# Patient Record
Sex: Male | Born: 1985 | Race: White | Hispanic: No | Smoking: Current every day smoker
Health system: Southern US, Community
[De-identification: ages and names within clinical notes are randomized; demographics above are authoritative.]

## PROBLEM LIST (undated history)

## (undated) DIAGNOSIS — J40 Bronchitis, not specified as acute or chronic: Secondary | ICD-10-CM

## (undated) DIAGNOSIS — J189 Pneumonia, unspecified organism: Secondary | ICD-10-CM

## (undated) DIAGNOSIS — J449 Chronic obstructive pulmonary disease, unspecified: Secondary | ICD-10-CM

---
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
## 2007-02-17 ENCOUNTER — Emergency Department (HOSPITAL_COMMUNITY): Admission: EM | Admit: 2007-02-17 | Discharge: 2007-02-17 | Payer: Self-pay | Admitting: Emergency Medicine

## 2016-10-17 ENCOUNTER — Encounter (INDEPENDENT_AMBULATORY_CARE_PROVIDER_SITE_OTHER): Payer: Self-pay | Admitting: Ophthalmology

## 2018-11-25 ENCOUNTER — Emergency Department (HOSPITAL_COMMUNITY): Payer: Self-pay

## 2018-11-25 ENCOUNTER — Emergency Department (HOSPITAL_COMMUNITY)
Admission: EM | Admit: 2018-11-25 | Discharge: 2018-11-25 | Disposition: A | Discharge status: Home / Self Care | Payer: Self-pay | Attending: Emergency Medicine | Admitting: Emergency Medicine

## 2018-11-25 ENCOUNTER — Other Ambulatory Visit: Payer: Self-pay

## 2018-11-25 ENCOUNTER — Encounter (HOSPITAL_COMMUNITY): Payer: Self-pay

## 2018-11-25 DIAGNOSIS — R059 Cough, unspecified: Secondary | ICD-10-CM

## 2018-11-25 DIAGNOSIS — R0602 Shortness of breath: Secondary | ICD-10-CM | POA: Insufficient documentation

## 2018-11-25 DIAGNOSIS — R05 Cough: Secondary | ICD-10-CM | POA: Insufficient documentation

## 2018-11-25 DIAGNOSIS — F17210 Nicotine dependence, cigarettes, uncomplicated: Secondary | ICD-10-CM | POA: Insufficient documentation

## 2018-11-25 DIAGNOSIS — Z20828 Contact with and (suspected) exposure to other viral communicable diseases: Secondary | ICD-10-CM | POA: Insufficient documentation

## 2018-11-25 MED ORDER — DOXYCYCLINE HYCLATE 100 MG PO CAPS: 100.0000 mg | ORAL_CAPSULE | Freq: Two times a day (BID) | ORAL | 0 refills | Status: AC

## 2018-11-25 MED ORDER — ALBUTEROL SULFATE (2.5 MG/3ML) 0.083% IN NEBU: 5.0000 mg | INHALATION_SOLUTION | Freq: Once | RESPIRATORY_TRACT | Status: DC

## 2018-11-25 MED ORDER — PREDNISONE 20 MG PO TABS: 40.0000 mg | ORAL_TABLET | Freq: Every day | ORAL | 0 refills | Status: AC

## 2018-11-25 MED ORDER — ALBUTEROL SULFATE HFA 108 (90 BASE) MCG/ACT IN AERS: 1.0000 | INHALATION_SPRAY | Freq: Four times a day (QID) | RESPIRATORY_TRACT | 0 refills | Status: AC | PRN

## 2018-11-25 NOTE — Discharge Instructions (Signed)
You were seen in the ED today with trouble breathing and cough.  I am testing you for COVID-19.  Please stay isolated until results come back and you are feeling better for at least 3 days prior to returning to work.  You can check the results in the MyChart app.  Return to the emergency department any new or suddenly worsening symptoms.  I am also calling in a prescription for steroid and antibiotic.  If your Covid test comes back positive you can stop these medications.  Return to the emergency department with any new or worsening symptoms.

## 2018-11-25 NOTE — ED Provider Notes (Signed)
Emergency Department Provider Note   I have reviewed the triage vital signs and the nursing notes.   HISTORY  Chief Complaint Shortness of Breath   HPI Cody Odom is a 33 y.o. male presents to the emergency department for evaluation of shortness of breath and cough worsening over the past 5 days.  He has not had fevers or shaking chills.  No sore throat, runny nose, loss of taste/smell.  Patient is a heavy smoker but has no prior diagnosis of asthma, COPD, other lung condition.  He is describing some chest discomfort but states it is only with coughing and he is not having any chest pain at rest.  No heart palpitations.  No radiation of symptoms or other modifying factors.  History reviewed. No pertinent past medical history.  There are no active problems to display for this patient.   History reviewed. No pertinent surgical history.  Allergies Shellfish allergy  History reviewed. No pertinent family history.  Social History Social History   Tobacco Use  . Smoking status: Current Every Day Smoker  . Smokeless tobacco: Never Used  Substance Use Topics  . Alcohol use: Yes  . Drug use: Never    Review of Systems  Constitutional: No fever/chills Eyes: No visual changes. ENT: No sore throat. Cardiovascular: Positive chest pain with cough. Respiratory: Positive shortness of breath and cough.  Gastrointestinal: No abdominal pain.  No nausea, no vomiting.  No diarrhea.  No constipation. Genitourinary: Negative for dysuria. Musculoskeletal: Negative for back pain. Skin: Negative for rash. Neurological: Negative for headaches, focal weakness or numbness.  10-point ROS otherwise negative.  ____________________________________________   PHYSICAL EXAM:  VITAL SIGNS: ED Triage Vitals  Enc Vitals Group     BP 11/25/18 1607 (!) 144/88     Pulse Rate 11/25/18 1607 92     Resp 11/25/18 1607 20     Temp 11/25/18 1607 98.3 F (36.8 C)     Temp Source 11/25/18 1607  Oral     SpO2 11/25/18 1607 100 %   Constitutional: Alert and oriented. Well appearing and in no acute distress. Eyes: Conjunctivae are normal.   Head: Atraumatic. Nose: No congestion/rhinnorhea. Mouth/Throat: Mucous membranes are moist. Neck: No stridor.   Cardiovascular: Normal rate, regular rhythm. Good peripheral circulation. Grossly normal heart sounds.   Respiratory: Normal respiratory effort.  No retractions. Lungs CTAB. Gastrointestinal: No distention.  Musculoskeletal: No gross deformities of extremities. Neurologic:  Normal speech and language. Skin:  Skin is warm, dry and intact. No rash noted.   ____________________________________________   LABS (all labs ordered are listed, but only abnormal results are displayed)  Labs Reviewed  NOVEL CORONAVIRUS, NAA (HOSP ORDER, SEND-OUT TO REF LAB; TAT 18-24 HRS)   ____________________________________________  EKG   EKG Interpretation  Date/Time:  Thursday November 25 2018 16:12:21 EST Ventricular Rate:  95 PR Interval:    QRS Duration: 95 QT Interval:  351 QTC Calculation: 442 R Axis:   56 Text Interpretation: Sinus rhythm Borderline T wave abnormalities No STEMI Confirmed by Alona Bene [id number]) on 11/25/2018 4:48:30 PM       ____________________________________________  RADIOLOGY  Dg Chest 2 View  Result Date: 11/25/2018 CLINICAL DATA:  Shortness of breath for the past 5 days. EXAM: CHEST - 2 VIEW COMPARISON:  None. FINDINGS: The heart size and mediastinal contours are within normal limits. Both lungs are clear. The visualized skeletal structures are unremarkable. IMPRESSION: No active cardiopulmonary disease. Electronically Signed   By: Vickki Hearing.D.  On: 11/25/2018 16:31    ____________________________________________   PROCEDURES  Procedure(s) performed:   Procedures  None  ____________________________________________   INITIAL IMPRESSION / ASSESSMENT AND PLAN / ED COURSE  Pertinent  labs & imaging results that were available during my care of the patient were reviewed by me and considered in my medical decision making (see chart for details).   Patient presents to the emergency department for evaluation of cough and shortness of breath.  No wheezing on exam.  No hypoxemia.  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  I am sending Covid testing and he will remain in isolation until his results come back and has been feeling well for at least 3 days.  Patient to follow results in [place].  Plan to treat his bronchitis with albuterol, steroid, antibiotics.  Patient to return with any new or worsening symptoms.  Also provided contact information for PCP.  Cody Odom was evaluated in Emergency Department on 11/25/2018 for the symptoms described in the history of present illness. He was evaluated in the context of the global COVID-19 pandemic, which necessitated consideration that the patient might be at risk for infection with the SARS-CoV-2 virus that causes COVID-19. Institutional protocols and algorithms that pertain to the evaluation of patients at risk for COVID-19 are in a state of rapid change based on information released by regulatory bodies including the CDC and federal and state organizations. These policies and algorithms were followed during the patient's care in the ED.  ____________________________________________  FINAL CLINICAL IMPRESSION(S) / ED DIAGNOSES  Final diagnoses:  SOB (shortness of breath)  Cough     MEDICATIONS GIVEN DURING THIS VISIT:  Medications  albuterol (PROVENTIL) (2.5 MG/3ML) 0.083% nebulizer solution 5 mg (has no administration in time range)     NEW OUTPATIENT MEDICATIONS STARTED DURING THIS VISIT:  New Prescriptions   ALBUTEROL (VENTOLIN HFA) 108 (90 BASE) MCG/ACT INHALER    Inhale 1-2 puffs into the lungs every 6 (six) hours as needed for shortness of breath.   DOXYCYCLINE (VIBRAMYCIN) 100  MG CAPSULE    Take 1 capsule (100 mg total) by mouth 2 (two) times daily for 7 days.   PREDNISONE (DELTASONE) 20 MG TABLET    Take 2 tablets (40 mg total) by mouth daily for 5 days.    Note:  This document was prepared using Dragon voice recognition software and may include unintentional dictation errors.  Nanda Quinton, MD, [facility] Emergency Medicine    Navid Lenzen, [facility], MD 11/25/18 [phone]

## 2018-11-25 NOTE — ED Triage Notes (Signed)
Pt presents with c/o shortness of breath for the past 5 days. Pt denies being around anyone who has been sick. Pt reports some chest pain as well. Pt in NAD. Pt also reports some ETOH use today.

## 2018-11-26 LAB — NOVEL CORONAVIRUS, NAA (HOSP ORDER, SEND-OUT TO REF LAB; TAT 18-24 HRS): SARS-CoV-2, NAA: NOT DETECTED

## 2019-01-31 ENCOUNTER — Ambulatory Visit: Payer: Self-pay | Attending: Internal Medicine

## 2019-01-31 DIAGNOSIS — Z20822 Contact with and (suspected) exposure to covid-19: Secondary | ICD-10-CM | POA: Insufficient documentation

## 2019-02-01 LAB — NOVEL CORONAVIRUS, NAA: SARS-CoV-2, NAA: NOT DETECTED

## 2021-06-07 ENCOUNTER — Emergency Department (HOSPITAL_COMMUNITY)
Admission: EM | Admit: 2021-06-07 | Discharge: 2021-06-07 | Disposition: A | Discharge status: Home / Self Care | Payer: Managed Care, Other (non HMO) | Attending: Emergency Medicine | Admitting: Emergency Medicine

## 2021-06-07 ENCOUNTER — Encounter (HOSPITAL_COMMUNITY): Payer: Self-pay

## 2021-06-07 ENCOUNTER — Other Ambulatory Visit: Payer: Self-pay

## 2021-06-07 DIAGNOSIS — X102XXA Contact with fats and cooking oils, initial encounter: Secondary | ICD-10-CM | POA: Diagnosis not present

## 2021-06-07 DIAGNOSIS — T23231A Burn of second degree of multiple right fingers (nail), not including thumb, initial encounter: Secondary | ICD-10-CM | POA: Diagnosis not present

## 2021-06-07 DIAGNOSIS — T310 Burns involving less than 10% of body surface: Secondary | ICD-10-CM | POA: Diagnosis not present

## 2021-06-07 DIAGNOSIS — T23031A Burn of unspecified degree of multiple right fingers (nail), not including thumb, initial encounter: Secondary | ICD-10-CM | POA: Diagnosis present

## 2021-06-07 DIAGNOSIS — Y93G3 Activity, cooking and baking: Secondary | ICD-10-CM | POA: Diagnosis not present

## 2021-06-07 MED ORDER — IBUPROFEN 800 MG PO TABS
800.0000 mg | ORAL_TABLET | Freq: Once | ORAL | Status: AC
  Administered 2021-06-07: 800 mg via ORAL
  Filled 2021-06-07: qty 1

## 2021-06-07 MED ORDER — BACITRACIN ZINC 500 UNIT/GM EX OINT
TOPICAL_OINTMENT | Freq: Once | CUTANEOUS | Status: AC
  Administered 2021-06-07: 1 via TOPICAL
  Filled 2021-06-07: qty 0.9

## 2021-06-07 MED ORDER — BACITRACIN ZINC 500 UNIT/GM EX OINT: 1.0000 "application " | TOPICAL_OINTMENT | Freq: Two times a day (BID) | CUTANEOUS | 0 refills | Status: AC

## 2021-06-07 MED ORDER — IBUPROFEN 800 MG PO TABS: 800.0000 mg | ORAL_TABLET | Freq: Three times a day (TID) | ORAL | 0 refills | Status: AC
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
## 2021-06-07 NOTE — Discharge Instructions (Addendum)
Please use Tylenol or ibuprofen for pain.  You may use 600 mg ibuprofen every 6 hours or 1000 mg of Tylenol every 6 hours.  You may choose to alternate between the 2.  This would be most effective.  Not to exceed 4 g of Tylenol within 24 hours.  Not to exceed 3200 mg ibuprofen 24 hours.  
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
## 2021-07-31 ENCOUNTER — Other Ambulatory Visit: Payer: Self-pay

## 2021-07-31 ENCOUNTER — Emergency Department (HOSPITAL_COMMUNITY)
Admission: EM | Admit: 2021-07-31 | Discharge: 2021-07-31 | Disposition: A | Discharge status: Home / Self Care | Payer: Managed Care, Other (non HMO) | Attending: Emergency Medicine | Admitting: Emergency Medicine

## 2021-07-31 ENCOUNTER — Encounter (HOSPITAL_COMMUNITY): Payer: Self-pay | Admitting: Emergency Medicine

## 2021-07-31 ENCOUNTER — Emergency Department (HOSPITAL_COMMUNITY): Payer: Managed Care, Other (non HMO)

## 2021-07-31 DIAGNOSIS — F1090 Alcohol use, unspecified, uncomplicated: Secondary | ICD-10-CM | POA: Insufficient documentation

## 2021-07-31 DIAGNOSIS — Z20822 Contact with and (suspected) exposure to covid-19: Secondary | ICD-10-CM | POA: Insufficient documentation

## 2021-07-31 DIAGNOSIS — Z7951 Long term (current) use of inhaled steroids: Secondary | ICD-10-CM | POA: Diagnosis not present

## 2021-07-31 DIAGNOSIS — R0602 Shortness of breath: Secondary | ICD-10-CM | POA: Insufficient documentation

## 2021-07-31 DIAGNOSIS — R079 Chest pain, unspecified: Secondary | ICD-10-CM | POA: Insufficient documentation

## 2021-07-31 DIAGNOSIS — Z87891 Personal history of nicotine dependence: Secondary | ICD-10-CM | POA: Diagnosis not present

## 2021-07-31 HISTORY — DX: Bronchitis, not specified as acute or chronic: J40

## 2021-07-31 HISTORY — DX: Pneumonia, unspecified organism: J18.9

## 2021-07-31 HISTORY — DX: Chronic obstructive pulmonary disease, unspecified: J44.9

## 2021-07-31 LAB — BLOOD GAS, VENOUS
Acid-Base Excess: 4.1 mmol/L — ABNORMAL HIGH (ref 0.0–2.0)
Bicarbonate: 29.8 mmol/L — ABNORMAL HIGH (ref 20.0–28.0)
O2 Saturation: 93.9 %
Patient temperature: 37
pCO2, Ven: 47 mmHg (ref 44–60)
pH, Ven: 7.41 (ref 7.25–7.43)
pO2, Ven: 66 mmHg — ABNORMAL HIGH (ref 32–45)

## 2021-07-31 LAB — CBC WITH DIFFERENTIAL/PLATELET
Abs Immature Granulocytes: 0.02 10*3/uL (ref 0.00–0.07)
Basophils Absolute: 0.1 10*3/uL (ref 0.0–0.1)
Basophils Relative: 1 %
Eosinophils Absolute: 0.2 10*3/uL (ref 0.0–0.5)
Eosinophils Relative: 2 %
HCT: 46.7 % (ref 39.0–52.0)
Hemoglobin: 16.1 g/dL (ref 13.0–17.0)
Immature Granulocytes: 0 %
Lymphocytes Relative: 47 %
Lymphs Abs: 4.1 10*3/uL — ABNORMAL HIGH (ref 0.7–4.0)
MCH: 31.9 pg (ref 26.0–34.0)
MCHC: 34.5 g/dL (ref 30.0–36.0)
MCV: 92.5 fL (ref 80.0–100.0)
Monocytes Absolute: 0.9 10*3/uL (ref 0.1–1.0)
Monocytes Relative: 10 %
Neutro Abs: 3.5 10*3/uL (ref 1.7–7.7)
Neutrophils Relative %: 40 %
Platelets: 268 10*3/uL (ref 150–400)
RBC: 5.05 MIL/uL (ref 4.22–5.81)
RDW: 13.9 % (ref 11.5–15.5)
WBC: 8.7 10*3/uL (ref 4.0–10.5)
nRBC: 0 % (ref 0.0–0.2)

## 2021-07-31 LAB — RESP PANEL BY RT-PCR (FLU A&B, COVID) ARPGX2
Influenza A by PCR: NEGATIVE
Influenza B by PCR: NEGATIVE
SARS Coronavirus 2 by RT PCR: NEGATIVE

## 2021-07-31 LAB — COMPREHENSIVE METABOLIC PANEL
ALT: 83 U/L — ABNORMAL HIGH (ref 0–44)
AST: 76 U/L — ABNORMAL HIGH (ref 15–41)
Albumin: 5 g/dL (ref 3.5–5.0)
Alkaline Phosphatase: 68 U/L (ref 38–126)
Anion gap: 12 (ref 5–15)
BUN: 11 mg/dL (ref 6–20)
CO2: 26 mmol/L (ref 22–32)
Calcium: 9.2 mg/dL (ref 8.9–10.3)
Chloride: 107 mmol/L (ref 98–111)
Creatinine, Ser: 0.52 mg/dL — ABNORMAL LOW (ref 0.61–1.24)
GFR, Estimated: >60 mL/min (ref >60)
Glucose, Bld: 107 mg/dL — ABNORMAL HIGH (ref 70–99)
Potassium: 4.2 mmol/L (ref 3.5–5.1)
Sodium: 145 mmol/L (ref 135–145)
Total Bilirubin: 0.7 mg/dL (ref 0.3–1.2)
Total Protein: 8.4 g/dL — ABNORMAL HIGH (ref 6.5–8.1)

## 2021-07-31 LAB — D-DIMER, QUANTITATIVE: D-Dimer, Quant: 0.8 ug/mL-FEU — ABNORMAL HIGH (ref 0.00–0.50)

## 2021-07-31 LAB — BRAIN NATRIURETIC PEPTIDE: B Natriuretic Peptide: 26.2 pg/mL (ref 0.0–100.0)

## 2021-07-31 LAB — TROPONIN I (HIGH SENSITIVITY): Troponin I (High Sensitivity): 5 ng/L (ref <18)

## 2021-07-31 MED ORDER — IOHEXOL 350 MG/ML SOLN
100.0000 mL | Freq: Once | INTRAVENOUS | Status: AC | PRN
  Administered 2021-07-31: 75 mL via INTRAVENOUS

## 2021-07-31 MED ORDER — SODIUM CHLORIDE (PF) 0.9 % IJ SOLN
INTRAMUSCULAR | Status: DC
Start: 2021-07-31 — End: 2021-07-31
  Filled 2021-07-31: qty 50

## 2021-07-31 NOTE — Discharge Instructions (Signed)
You were seen in the emergency department for your chest pain and your shortness of breath.  You had no signs of heart attack or abnormal heart rhythm.  You had no signs of blood clots in your lungs and your lungs appeared fully inflated without any signs of infection.  Did not have any wheezing on your exam making bronchitis unlikely cause of your symptoms.  It is unclear what caused your symptoms today but you should follow-up with your primary doctor to have your symptoms rechecked and cardiology as needed if you are continuing to have chest pain.  You should return to the emergency department if your pain gets significantly worse, you continue to pass out, you have significantly worsening shortness of breath, you have repetitive vomiting, or if you have any other new or concerning symptoms.

## 2021-07-31 NOTE — ED Notes (Signed)
Patient denies drug use, but does admit to alcohol use at night.

## 2021-07-31 NOTE — ED Provider Notes (Signed)
[facility]-EMERGENCY DEPT Provider Note   CSN: [id number] Arrival date & time: 07/31/21  1246     History  No chief complaint on file.   Cody Odom is a 36 y.o. male.  Patient is a 36 year old male with a past medical history of alcohol use disorder and tobacco use presenting to the emergency department with chest pain, shortness of breath and episode of unresponsiveness.  Per the patient he states that he has had left upper chest pain for the past 2 days.  He states that when he woke up this morning he felt short of breath like he was unable to take a complete breath.  He states that he then stopped breathing and had an episode of unresponsiveness when his wife called 911.  On his arrival here, he states that he now feels awake and alert but is unable to still take a complete breath.  He states his chest pain is now resolved.  He denies any recent fevers or cough.  He states he has been taking his daily inhalers for chronic bronchitis as prescribed.  Denies any associated nausea, vomiting or diarrhea or lower extremity swelling.  He denies any history of VTE, recent hospitalizations or surgery, recent long travels in the car or plane, cancer history.  He states his last drink was shortly prior to arrival.  The history is provided by the patient.       Home Medications Prior to Admission medications   Medication Sig Start Date End Date Taking? Authorizing Provider  albuterol (VENTOLIN HFA) 108 (90 Base) MCG/ACT inhaler Inhale 1-2 puffs into the lungs every 6 (six) hours as needed for shortness of breath. 11/25/18   Long, Arlyss Repress, MD  bacitracin ointment Apply 1 application. topically 2 (two) times daily. 06/07/21   Prosperi, Christian H, PA-C  ibuprofen (ADVIL) 800 MG tablet Take 1 tablet (800 mg total) by mouth 3 (three) times daily. 06/07/21   Prosperi, Christian H, PA-C      Allergies    Shellfish allergy    Review of Systems   Review of Systems  Physical  Exam Updated Vital Signs BP 124/86   Pulse 81   Temp 98 F (36.7 C) (Oral)   Resp 15   SpO2 97%  Physical Exam Constitutional:      Appearance: Normal appearance.  HENT:     Head: Normocephalic and atraumatic.     Mouth/Throat:     Mouth: Mucous membranes are moist.  Eyes:     Conjunctiva/sclera: Conjunctivae normal.     Pupils: Pupils are equal, round, and reactive to light.  Cardiovascular:     Rate and Rhythm: Regular rhythm. Tachycardia present.  Pulmonary:     Effort: Pulmonary effort is normal.     Breath sounds: Normal breath sounds.  Abdominal:     General: Abdomen is flat.     Palpations: Abdomen is soft.  Musculoskeletal:        General: Normal range of motion.     Right lower leg: No edema.     Left lower leg: No edema.  Skin:    General: Skin is warm and dry.  Neurological:     General: No focal deficit present.     Mental Status: He is alert and oriented to person, place, and time.  Psychiatric:        Mood and Affect: Mood normal.        Behavior: Behavior normal.     ED Results / Procedures /  Treatments   Labs (all labs ordered are listed, but only abnormal results are displayed) Labs Reviewed  COMPREHENSIVE METABOLIC PANEL - Abnormal; Notable for the following components:      Result Value   Glucose, Bld 107 (*)    Creatinine, Ser 0.52 (*)    Total Protein 8.4 (*)    AST 76 (*)    ALT 83 (*)    All other components within normal limits  BLOOD GAS, VENOUS - Abnormal; Notable for the following components:   pO2, Ven 66 (*)    Bicarbonate 29.8 (*)    Acid-Base Excess 4.1 (*)    All other components within normal limits  CBC WITH DIFFERENTIAL/PLATELET - Abnormal; Notable for the following components:   Lymphs Abs 4.1 (*)    All other components within normal limits  D-DIMER, QUANTITATIVE - Abnormal; Notable for the following components:   D-Dimer, Quant 0.80 (*)    All other components within normal limits  RESP PANEL BY RT-PCR (FLU A&B,  COVID) ARPGX2  BRAIN NATRIURETIC PEPTIDE  TROPONIN I (HIGH SENSITIVITY)    EKG None  Radiology CT Angio Chest Pulmonary Embolism (PE) W or WO Contrast  Result Date: 07/31/2021 CLINICAL DATA:  Chest pain, cough, shortness of breath EXAM: CT ANGIOGRAPHY CHEST WITH CONTRAST TECHNIQUE: Multidetector CT imaging of the chest was performed using the standard protocol during bolus administration of intravenous contrast. Multiplanar CT image reconstructions and MIPs were obtained to evaluate the vascular anatomy. RADIATION DOSE REDUCTION: This exam was performed according to the departmental dose-optimization program which includes automated exposure control, adjustment of the mA and/or kV according to patient size and/or use of iterative reconstruction technique. CONTRAST:  17mL OMNIPAQUE IOHEXOL 350 MG/ML SOLN COMPARISON:  None Available. FINDINGS: Cardiovascular: No filling defects in the pulmonary arteries to suggest pulmonary emboli. Heart is normal size. Aorta is normal caliber. Mediastinum/Nodes: No mediastinal, hilar, or axillary adenopathy. Trachea and esophagus are unremarkable. Thyroid unremarkable. Lungs/Pleura: Lungs are clear. No focal airspace opacities or suspicious nodules. No effusions. Upper Abdomen: Low-density diffusely throughout the liver compatible with fatty infiltration. Musculoskeletal: Chest wall soft tissues are unremarkable. No acute bony abnormality. Review of the MIP images confirms the above findings. IMPRESSION: No evidence of pulmonary embolus. No acute cardiopulmonary disease. Fatty liver. Electronically Signed   By: Charlett Nose M.D.   On: 07/31/2021 15:45   DG Chest 2 View  Result Date: 07/31/2021 CLINICAL DATA:  Chest pain, shortness of breath. EXAM: CHEST - 2 VIEW COMPARISON:  None Available. FINDINGS: No consolidation. No visible pleural effusions or pneumothorax. Cardiomediastinal silhouette is within normal limits and similar to prior. No acute osseous abnormality.  IMPRESSION: No evidence of acute cardiopulmonary disease. Electronically Signed   By: Feliberto Harts M.D.   On: 07/31/2021 13:13    Procedures Procedures    Medications Ordered in ED Medications  sodium chloride (PF) 0.9 % injection (has no administration in time range)  iohexol (OMNIPAQUE) 350 MG/ML injection 100 mL (75 mLs Intravenous Contrast Given 07/31/21 1531)    ED Course/ Medical Decision Making/ A&P Clinical Course as of 07/31/21 1556  Wed Jul 31, 2021  1327 CXR negative for acute disease. EKG with sinus tachycardia, no acute ischemic changes  [VK]  1441 D-Dimer, Quant(!): 0.80 D-dimer is elevated, patient will have CTPE study performed. Remained of labs within normal range. [VK]  1450 Patient updated on plan, reports mild L lower chest pain but declines any med medications at this time. Patient continues to be well  appearing in no acute distress [VK]  1549 CTPE negative  [VK]  1555 Patient continues to be well appearing and feels well with normal vital signs.  Stable for discharge home.  Is given primary care and cardiology follow-up as noted.  He is given strict return precautions [VK]    Clinical Course User Index [VK] Phoebe Sharps, DO                           Medical Decision Making Patient is a 36 year old male with a past medical history of alcohol use disorder and tobacco use presenting to the emergency department with chest pain, shortness of breath and episode of unresponsiveness.  Upon patient's arrival to the emergency department he is awake and alert mildly tachycardic but otherwise in no acute distress.  EKG was performed to evaluate for ACS or arrhythmia that showed sinus tachycardia without any ischemic changes.  I am additionally concerned for PE, pneumothorax, pneumonia.  He has no wheezing on exam making acute bronchitis unlikely.  Patient denies any drug use and was not given any Narcan prior to arrival, making drug overdose unlikely.  Amount and/or  Complexity of Data Reviewed Labs: ordered. Decision-making details documented in ED Course. Radiology: ordered.  Risk Prescription drug management.           Final Clinical Impression(s) / ED Diagnoses Final diagnoses:  Nonspecific chest pain  Shortness of breath    Rx / DC Orders ED Discharge Orders     None         Phoebe Sharps, DO 07/31/21 1556

## 2021-07-31 NOTE — ED Triage Notes (Signed)
Pt arrived to ED with Wife. Wife informed staff that she couldn't get the patient to wake up and get out of the car. This RN and other staff went to get him out of the car. Pt appeared to be apneic at the time this RN approached the patient. Pt was only responsive to painful stimuli. Pt was taken straight back to a room via a wheelchair.

## 2021-09-09 IMAGING — CR DG CHEST 2V
2 series · 2 of 2 positions shown · non-contrast
Comparison: None.

CLINICAL DATA: Shortness of breath for the past 5 days.

EXAM:
CHEST - 2 VIEW

[w chest pa]
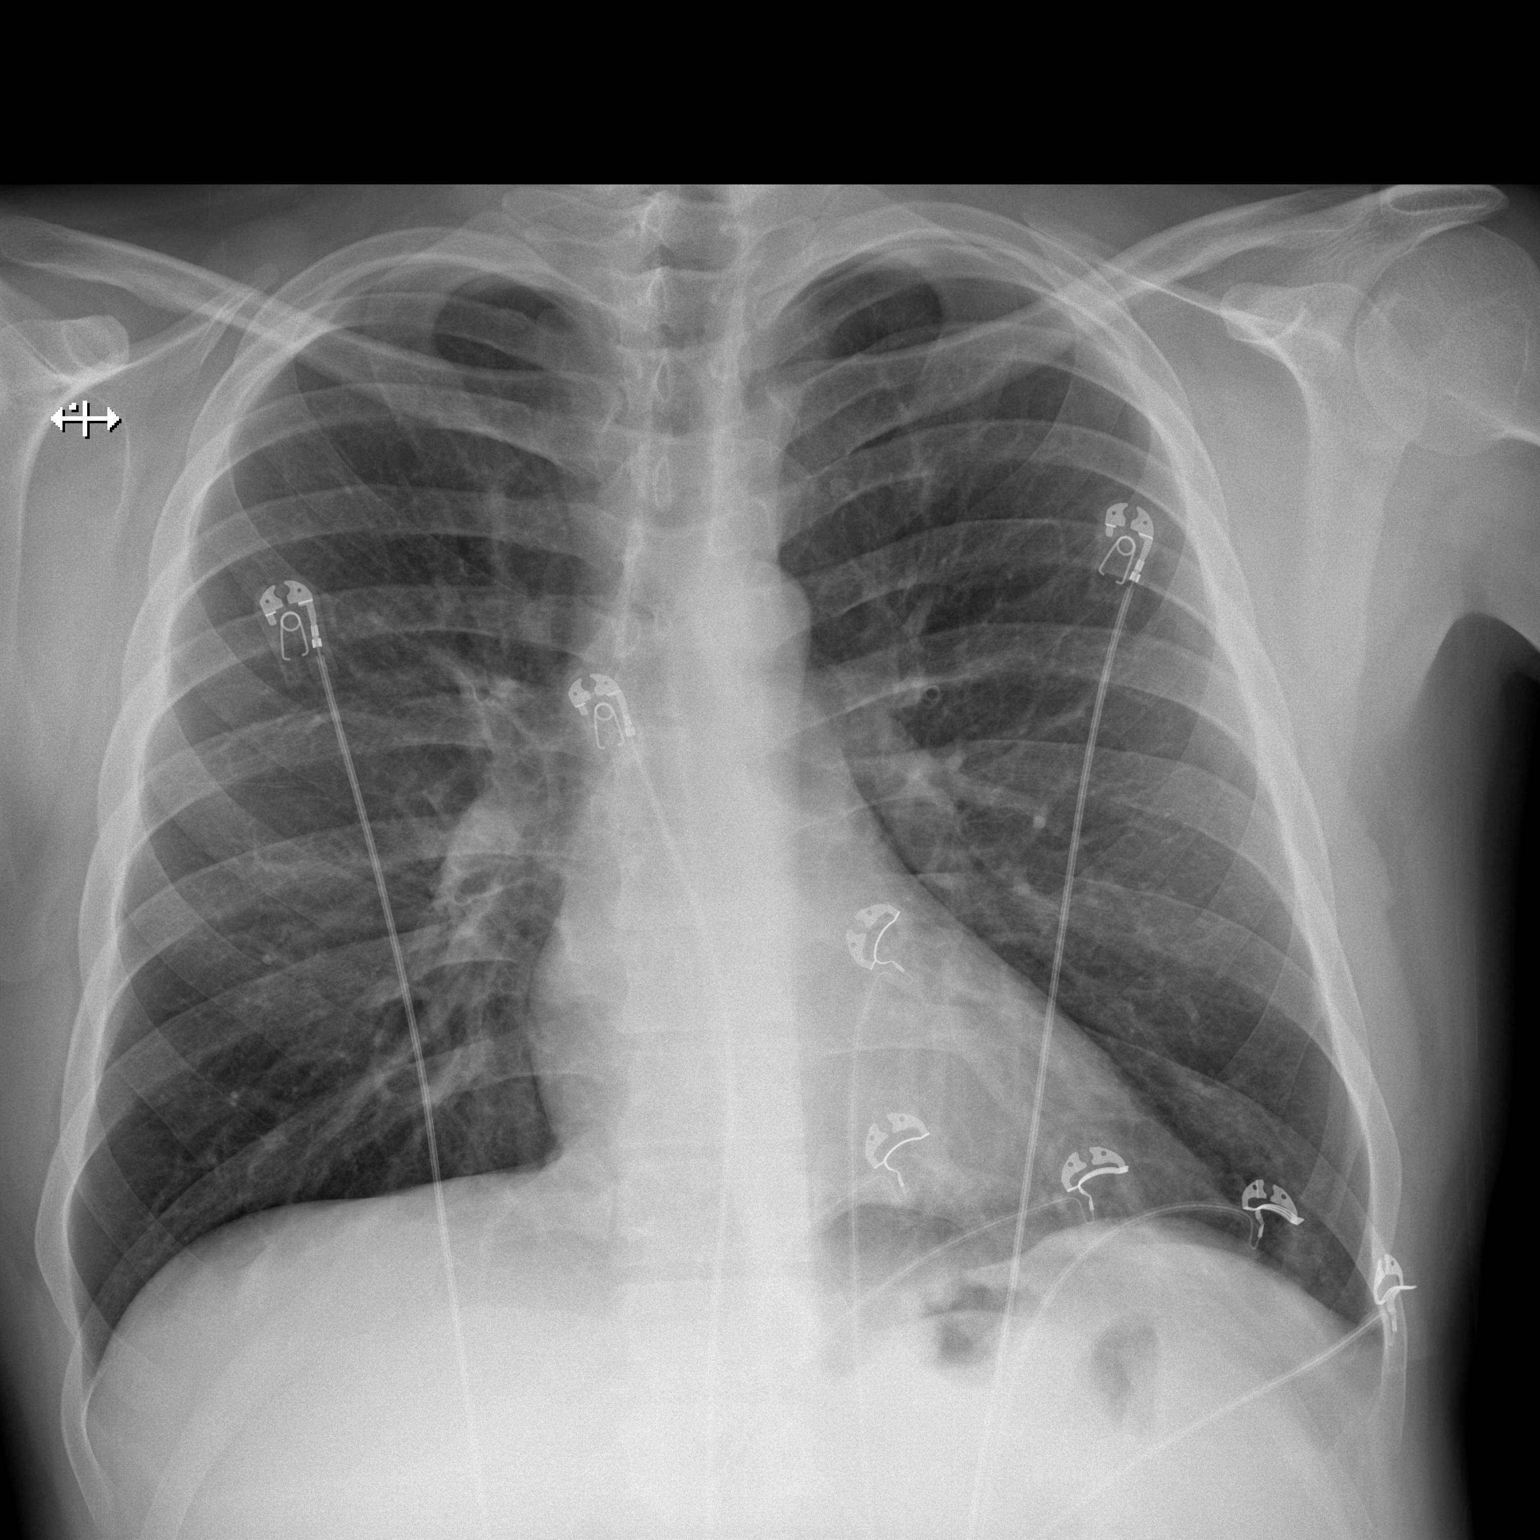

[w chest lat]
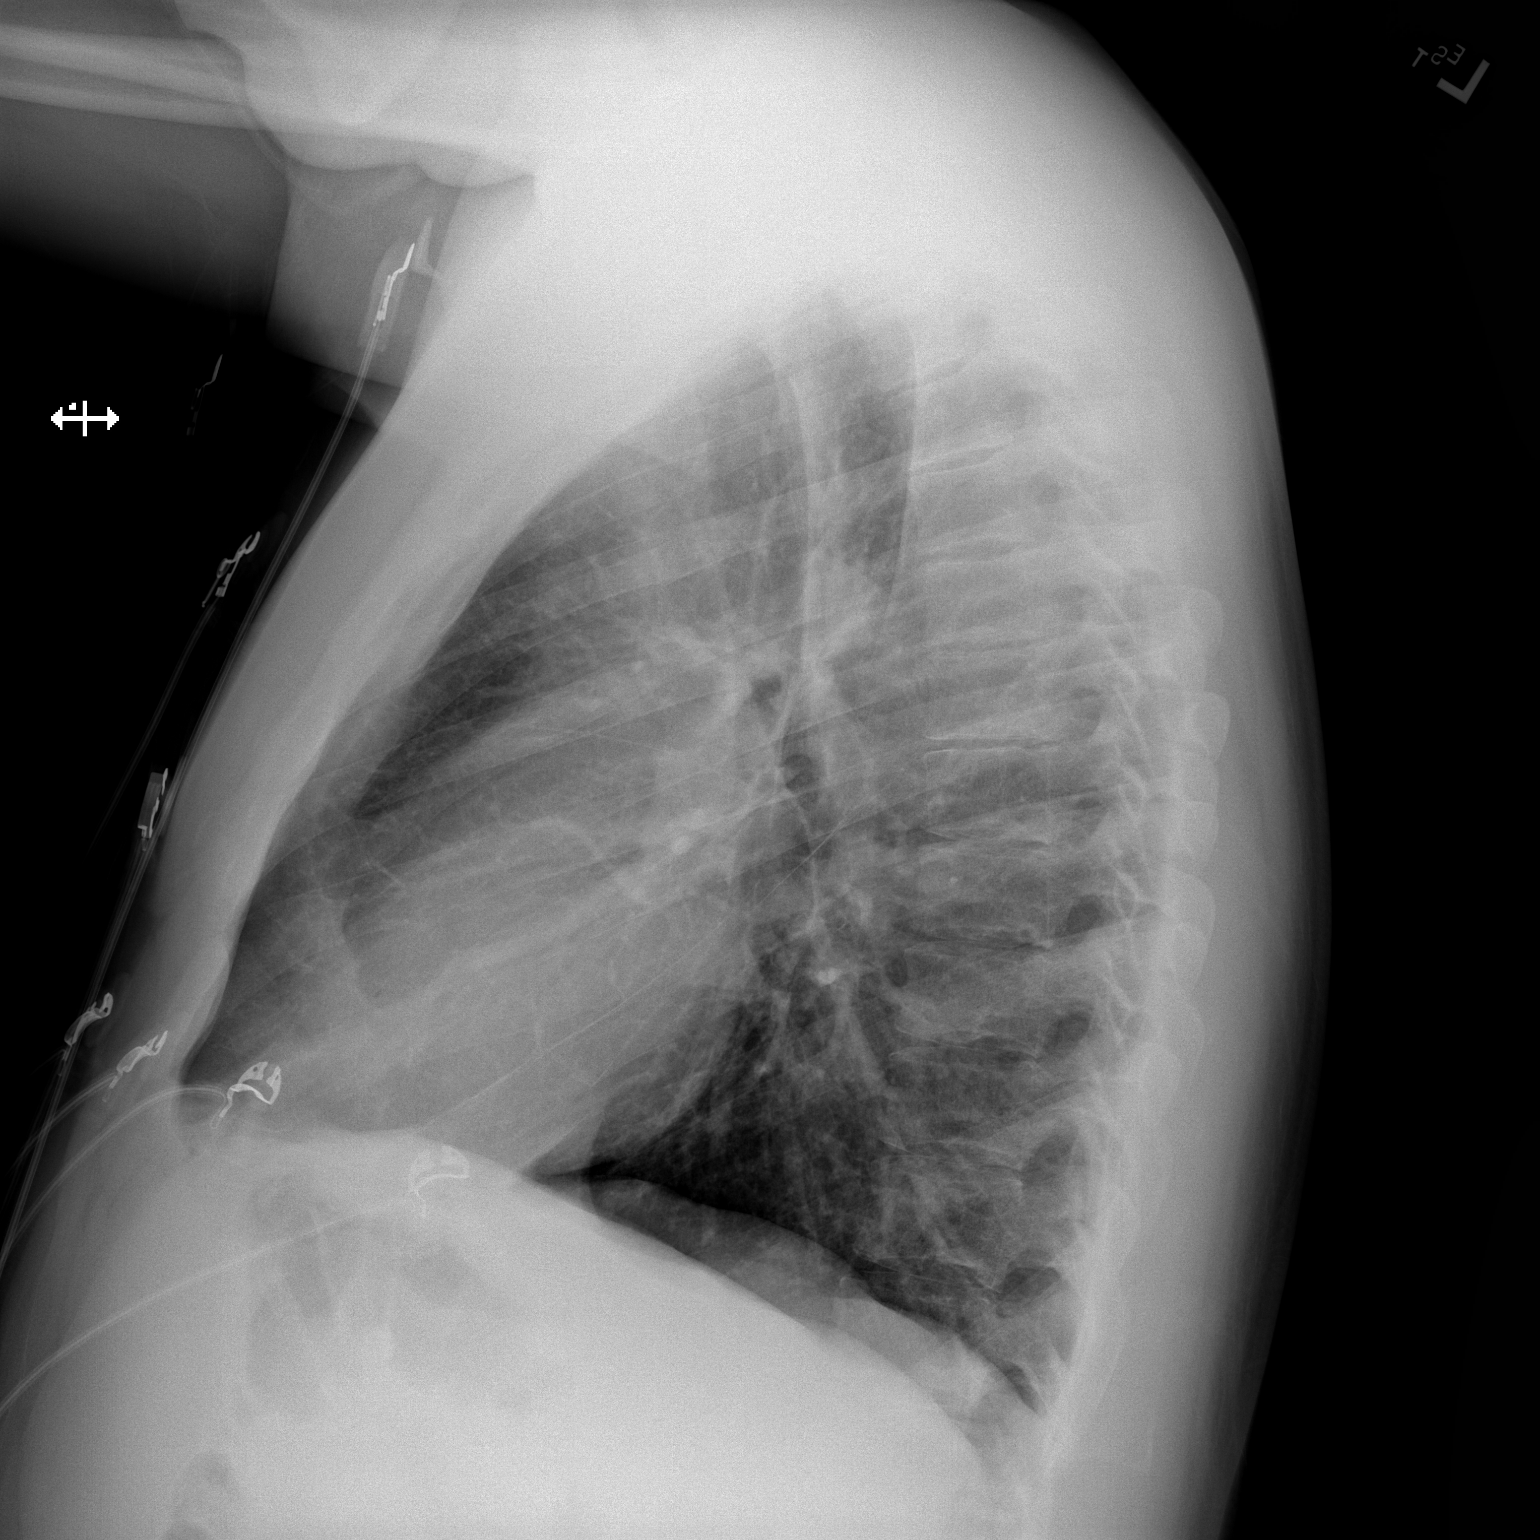

[2 of 2 positions shown; findings below may reference images not displayed]

FINDINGS: The heart size and mediastinal contours are within normal limits.
Both lungs are clear. The visualized skeletal structures are
unremarkable.
IMPRESSION: No active cardiopulmonary disease.

## 2021-10-09 ENCOUNTER — Encounter (HOSPITAL_BASED_OUTPATIENT_CLINIC_OR_DEPARTMENT_OTHER): Payer: Self-pay

## 2021-10-09 ENCOUNTER — Emergency Department (HOSPITAL_BASED_OUTPATIENT_CLINIC_OR_DEPARTMENT_OTHER): Payer: No Typology Code available for payment source | Admitting: Radiology

## 2021-10-09 ENCOUNTER — Emergency Department (HOSPITAL_BASED_OUTPATIENT_CLINIC_OR_DEPARTMENT_OTHER)
Admission: EM | Admit: 2021-10-09 | Discharge: 2021-10-10 | Disposition: A | Discharge status: Home / Self Care | Payer: No Typology Code available for payment source | Attending: Emergency Medicine | Admitting: Emergency Medicine

## 2021-10-09 ENCOUNTER — Emergency Department (HOSPITAL_BASED_OUTPATIENT_CLINIC_OR_DEPARTMENT_OTHER): Payer: No Typology Code available for payment source

## 2021-10-09 ENCOUNTER — Other Ambulatory Visit: Payer: Self-pay

## 2021-10-09 DIAGNOSIS — S3992XA Unspecified injury of lower back, initial encounter: Secondary | ICD-10-CM | POA: Diagnosis present

## 2021-10-09 DIAGNOSIS — W19XXXA Unspecified fall, initial encounter: Secondary | ICD-10-CM

## 2021-10-09 DIAGNOSIS — W548XXA Other contact with dog, initial encounter: Secondary | ICD-10-CM | POA: Insufficient documentation

## 2021-10-09 DIAGNOSIS — S32009A Unspecified fracture of unspecified lumbar vertebra, initial encounter for closed fracture: Secondary | ICD-10-CM

## 2021-10-09 DIAGNOSIS — R0781 Pleurodynia: Secondary | ICD-10-CM | POA: Insufficient documentation

## 2021-10-09 DIAGNOSIS — S32049A Unspecified fracture of fourth lumbar vertebra, initial encounter for closed fracture: Secondary | ICD-10-CM | POA: Insufficient documentation

## 2021-10-09 DIAGNOSIS — S32039A Unspecified fracture of third lumbar vertebra, initial encounter for closed fracture: Secondary | ICD-10-CM | POA: Diagnosis not present

## 2021-10-09 LAB — URINALYSIS, ROUTINE W REFLEX MICROSCOPIC
Bilirubin Urine: NEGATIVE
Glucose, UA: NEGATIVE mg/dL
Hgb urine dipstick: NEGATIVE
Ketones, ur: NEGATIVE mg/dL
Leukocytes,Ua: NEGATIVE
Nitrite: NEGATIVE
Specific Gravity, Urine: 1.018 (ref 1.005–1.030)
pH: 7 (ref 5.0–8.0)

## 2021-10-09 MED ORDER — METHOCARBAMOL 500 MG PO TABS
500.0000 mg | ORAL_TABLET | Freq: Once | ORAL | Status: AC
  Administered 2021-10-09: 500 mg via ORAL
  Filled 2021-10-09: qty 1

## 2021-10-09 MED ORDER — IOHEXOL 300 MG/ML  SOLN
100.0000 mL | Freq: Once | INTRAMUSCULAR | Status: AC | PRN
  Administered 2021-10-09: 80 mL via INTRAVENOUS

## 2021-10-09 MED ORDER — HYDROCODONE-ACETAMINOPHEN 5-325 MG PO TABS: 1.0000 | ORAL_TABLET | ORAL | 0 refills | Status: AC | PRN

## 2021-10-09 MED ORDER — OXYCODONE-ACETAMINOPHEN 5-325 MG PO TABS
1.0000 | ORAL_TABLET | Freq: Once | ORAL | Status: AC
  Administered 2021-10-09: 1 via ORAL
  Filled 2021-10-09: qty 1

## 2021-10-09 NOTE — Discharge Instructions (Signed)
Your testing shows transverse process fractures in your back.  You may wear the brace for comfort but it is not required.  There is an incidental nodule on your adrenal gland which should have followup with your doctor with MRI. Take the pain medication as prescribed.  Follow-up with your primary doctor as well as the neurosurgeon Dr. Ronnald Ramp.  Return to the ED with worsening pain, weakness, numbness, tingling, bowel or bladder incontinence or any other concerns

## 2021-10-09 NOTE — ED Triage Notes (Signed)
Pt states his dog knocked him off the top of the stairs, approx 4 ft high. Pt c/o pain in his lower back and right shoulder.

## 2021-10-09 NOTE — ED Provider Notes (Signed)
[facility] EMERGENCY DEPT Provider Note   CSN: [id number] Arrival date & time: 10/09/21  1754     History {Add pertinent medical, surgical, social history, OB history to HPI:1} Chief Complaint  Patient presents with   [name] is a 36 y.o. male.  Patient with low back pain and rib pain after falling on the stairs approximately 1 week ago.  States he was pulled down by his dog who was chasing an animal and landed on his low back.  Has severe pain to his right low back that radiates down both legs.  Has been taking acetaminophen at home without relief.  Does have a history of chronic back pain but no previous back surgeries.  Pain is to his right mid lumbar spine.  No associated weakness, numbness or tingling.  No bowel or bladder incontinence.  No fever or vomiting.  No history of IV drug abuse or cancer.  Having some pain to his ribs with movement of his right arm as well.  Does not feel short of breath.  No blood thinner use.  The history is provided by the patient.  Fall Pertinent negatives include no abdominal pain, no headaches and no shortness of breath.       Home Medications Prior to Admission medications   Medication Sig Start Date End Date Taking? Authorizing Provider  albuterol (VENTOLIN HFA) 108 (90 Base) MCG/ACT inhaler Inhale 1-2 puffs into the lungs every 6 (six) hours as needed for shortness of breath. 11/25/18   Long, [facility], MD  bacitracin ointment Apply 1 application. topically 2 (two) times daily. 06/07/21   Prosperi, Christian H, PA-C  ibuprofen (ADVIL) 800 MG tablet Take 1 tablet (800 mg total) by mouth 3 (three) times daily. 06/07/21   Prosperi, Christian H, PA-C      Allergies    Shellfish allergy    Review of Systems   Review of Systems  Constitutional:  Negative for activity change, appetite change and fever.  HENT:  Negative for congestion.   Respiratory:  Negative for cough, chest tightness and shortness of breath.    Gastrointestinal:  Negative for abdominal pain, nausea and vomiting.  Genitourinary:  Negative for dysuria and hematuria.  Musculoskeletal:  Positive for arthralgias and myalgias.  Neurological:  Negative for dizziness, weakness and headaches.   all other systems are negative except as noted in the HPI and PMH.    Physical Exam Updated Vital Signs BP 128/86   Pulse 99   Temp 97.9 F (36.6 C) (Oral)   Resp 18   Ht 6\' 2"  (1.88 m)   Wt 95.3 kg   SpO2 100%   BMI 26.96 kg/m  Physical Exam Vitals and nursing note reviewed.  Constitutional:      General: He is not in acute distress.    Appearance: He is well-developed.  HENT:     Head: Normocephalic and atraumatic.     Mouth/Throat:     Pharynx: No oropharyngeal exudate.  Eyes:     Conjunctiva/sclera: Conjunctivae normal.     Pupils: Pupils are equal, round, and reactive to light.  Neck:     Comments: No midline tenderness Cardiovascular:     Rate and Rhythm: Normal rate and regular rhythm.     Heart sounds: Normal heart sounds. No murmur heard. Pulmonary:     Effort: Pulmonary effort is normal. No respiratory distress.     Breath sounds: Normal breath sounds.  Abdominal:     Palpations:  Abdomen is soft.     Tenderness: There is no abdominal tenderness. There is no guarding or rebound.  Musculoskeletal:        General: Tenderness present. Normal range of motion.     Cervical back: Normal range of motion and neck supple.     Comments: Midline lumbar spine tenderness, no step-offs, paraspinal tenderness on the right.  5/5 strength in bilateral lower extremities. Ankle plantar and dorsiflexion intact. Great toe extension intact bilaterally. +2 DP and PT pulses. +2 patellar reflexes bilaterally. Normal gait.    Skin:    General: Skin is warm.  Neurological:     Mental Status: He is alert and oriented to person, place, and time.     Cranial Nerves: No cranial nerve deficit.     Motor: No abnormal muscle tone.      Coordination: Coordination normal.     Comments:  5/5 strength throughout. CN 2-12 intact.Equal grip strength.   Psychiatric:        Behavior: Behavior normal.     ED Results / Procedures / Treatments   Labs (all labs ordered are listed, but only abnormal results are displayed) Labs Reviewed  URINALYSIS, ROUTINE W REFLEX MICROSCOPIC    EKG None  Radiology DG Lumbar Spine 2-3 Views  Result Date: 10/09/2021 CLINICAL DATA:  Recent fall from stairs with low back pain, initial encounter EXAM: LUMBAR SPINE - 3 VIEW COMPARISON:  None Available. FINDINGS: Five lumbar type vertebral bodies are well visualized. Vertebral body height is well maintained. Pars defects are noted at L5 with minimal anterolisthesis on S1. No soft tissue abnormality is seen. IMPRESSION: Left pars defects with mild anterolisthesis. No acute abnormality noted. Electronically Signed   By: Inez Catalina M.D.   On: 10/09/2021 19:41   DG Shoulder Right  Result Date: 10/09/2021 CLINICAL DATA:  Recent fall from stairs, initial encounter EXAM: RIGHT SHOULDER - 2+ VIEW COMPARISON:  None Available. FINDINGS: There is no evidence of fracture or dislocation. There is no evidence of arthropathy or other focal bone abnormality. Soft tissues are unremarkable. IMPRESSION: No acute abnormality noted. Electronically Signed   By: Inez Catalina M.D.   On: 10/09/2021 19:40   DG Ribs Unilateral W/Chest Right  Result Date: 10/09/2021 CLINICAL DATA:  Trauma, fall EXAM: RIGHT RIBS AND CHEST - 3+ VIEW COMPARISON:  07/31/2021 FINDINGS: No fracture or other bone lesions are seen involving the ribs. There is no evidence of pneumothorax or pleural effusion. Both lungs are clear. Heart size and mediastinal contours are within normal limits. IMPRESSION: No displaced fractures are seen in right ribs. No acute cardiopulmonary disease. Electronically Signed   By: Elmer Picker M.D.   On: 10/09/2021 19:38    Procedures Procedures  {Document cardiac  monitor, telemetry assessment procedure when appropriate:1}  Medications Ordered in ED Medications  oxyCODONE-acetaminophen (PERCOCET/ROXICET) 5-325 MG per tablet 1 tablet (has no administration in time range)  methocarbamol (ROBAXIN) tablet 500 mg (has no administration in time range)    ED Course/ Medical Decision Making/ A&P                           Medical Decision Making Amount and/or Complexity of Data Reviewed Labs: ordered. Decision-making details documented in ED Course. Radiology: ordered and independent interpretation performed. Decision-making details documented in ED Course. ECG/medicine tests: ordered and independent interpretation performed. Decision-making details documented in ED Course.  Risk Prescription drug management.   Back contusion after falling a week  ago.  Increased pain going down his legs.  Low suspicion for cord compression or cauda equina.  X rays in triage are negative for fractures. Results reviewed and interpreted by me.  {Document critical care time when appropriate:1} {Document review of labs and clinical decision tools ie heart score, Chads2Vasc2 etc:1}  {Document your independent review of radiology images, and any outside records:1} {Document your discussion with family members, caretakers, and with consultants:1} {Document social determinants of health affecting pt's care:1} {Document your decision making why or why not admission, treatments were needed:1} Final Clinical Impression(s) / ED Diagnoses Final diagnoses:  None    Rx / DC Orders ED Discharge Orders     None

## 2021-10-09 NOTE — Progress Notes (Signed)
Orthopedic Tech Progress Note Patient Details:  Cody Odom 15-Aug-1985 [id number]  Called in [facility] BRACE to [facility].  Patient ID: [name], male   DOB: [birth year], 36 y.o.   MRN: [id number]  Arville Go 10/09/2021, 11:55 PM

## 2021-10-10 NOTE — ED Notes (Signed)
Ortho tech called to page [facility] representative to bring brace to [facility] ED stat

## 2022-07-29 ENCOUNTER — Emergency Department (HOSPITAL_COMMUNITY): Payer: No Typology Code available for payment source

## 2022-07-29 ENCOUNTER — Other Ambulatory Visit: Payer: Self-pay

## 2022-07-29 ENCOUNTER — Inpatient Hospital Stay (HOSPITAL_COMMUNITY)
Admission: EM | Admit: 2022-07-29 | Discharge: 2022-07-30 | DRG: 392 | Disposition: A | Discharge status: Home / Self Care | Payer: No Typology Code available for payment source | Attending: Emergency Medicine | Admitting: Emergency Medicine

## 2022-07-29 DIAGNOSIS — K2900 Acute gastritis without bleeding: Secondary | ICD-10-CM | POA: Diagnosis not present

## 2022-07-29 DIAGNOSIS — Z91013 Allergy to seafood: Secondary | ICD-10-CM

## 2022-07-29 DIAGNOSIS — R109 Unspecified abdominal pain: Secondary | ICD-10-CM | POA: Diagnosis not present

## 2022-07-29 DIAGNOSIS — K561 Intussusception: Secondary | ICD-10-CM | POA: Diagnosis present

## 2022-07-29 DIAGNOSIS — R1010 Upper abdominal pain, unspecified: Principal | ICD-10-CM

## 2022-07-29 LAB — CBC
HCT: 45.6 % (ref 39.0–52.0)
Hemoglobin: 15.9 g/dL (ref 13.0–17.0)
MCH: 32.3 pg (ref 26.0–34.0)
MCHC: 34.9 g/dL (ref 30.0–36.0)
MCV: 92.7 fL (ref 80.0–100.0)
Platelets: 229 10*3/uL (ref 150–400)
RBC: 4.92 MIL/uL (ref 4.22–5.81)
RDW: 12.8 % (ref 11.5–15.5)
WBC: 8.2 10*3/uL (ref 4.0–10.5)
nRBC: 0 % (ref 0.0–0.2)

## 2022-07-29 LAB — URINALYSIS, ROUTINE W REFLEX MICROSCOPIC
Bilirubin Urine: NEGATIVE
Glucose, UA: NEGATIVE mg/dL
Hgb urine dipstick: NEGATIVE
Ketones, ur: NEGATIVE mg/dL
Leukocytes,Ua: NEGATIVE
Nitrite: NEGATIVE
Protein, ur: NEGATIVE mg/dL
Specific Gravity, Urine: 1.004 — ABNORMAL LOW (ref 1.005–1.030)
pH: 6 (ref 5.0–8.0)

## 2022-07-29 LAB — COMPREHENSIVE METABOLIC PANEL
ALT: 73 U/L — ABNORMAL HIGH (ref 0–44)
AST: 54 U/L — ABNORMAL HIGH (ref 15–41)
Albumin: 5.3 g/dL — ABNORMAL HIGH (ref 3.5–5.0)
Alkaline Phosphatase: 56 U/L (ref 38–126)
Anion gap: 12 (ref 5–15)
BUN: 8 mg/dL (ref 6–20)
CO2: 23 mmol/L (ref 22–32)
Calcium: 9.6 mg/dL (ref 8.9–10.3)
Chloride: 98 mmol/L (ref 98–111)
Creatinine, Ser: 0.81 mg/dL (ref 0.61–1.24)
GFR, Estimated: >60 mL/min (ref >60)
Glucose, Bld: 101 mg/dL — ABNORMAL HIGH (ref 70–99)
Potassium: 3.8 mmol/L (ref 3.5–5.1)
Sodium: 133 mmol/L — ABNORMAL LOW (ref 135–145)
Total Bilirubin: 1.5 mg/dL — ABNORMAL HIGH (ref 0.3–1.2)
Total Protein: 8.3 g/dL — ABNORMAL HIGH (ref 6.5–8.1)

## 2022-07-29 LAB — LIPASE, BLOOD: Lipase: 26 U/L (ref 11–51)

## 2022-07-29 MED ORDER — PANTOPRAZOLE SODIUM 40 MG IV SOLR
40.0000 mg | Freq: Once | INTRAVENOUS | Status: AC
  Administered 2022-07-29: 40 mg via INTRAVENOUS
  Filled 2022-07-29: qty 10

## 2022-07-29 MED ORDER — SODIUM CHLORIDE 0.9 % IV SOLN: Freq: Once | INTRAVENOUS | Status: AC

## 2022-07-29 MED ORDER — SODIUM CHLORIDE 0.9 % IV BOLUS
1000.0000 mL | Freq: Once | INTRAVENOUS | Status: AC
  Administered 2022-07-29: 1000 mL via INTRAVENOUS

## 2022-07-29 MED ORDER — HYDROMORPHONE HCL 1 MG/ML IJ SOLN: 0.5000 mg | INTRAMUSCULAR | Status: DC | PRN

## 2022-07-29 MED ORDER — ONDANSETRON HCL 4 MG/2ML IJ SOLN: 4.0000 mg | Freq: Four times a day (QID) | INTRAMUSCULAR | Status: DC | PRN

## 2022-07-29 NOTE — ED Provider Notes (Signed)
[facility] EMERGENCY DEPARTMENT AT [facility] Provider Note   CSN: [id number] Arrival date & time: 07/29/22  2019     History {Add pertinent medical, surgical, social history, OB history to HPI:1} Chief Complaint  Patient presents with   Emesis    Cody Odom is a 37 y.o. male.  Patient complains of periumbilical pain with vomiting.  Patient states he vomited up some blood.  Patient has a history of mild alcohol use.  No other past medical history  The history is provided by the patient and medical records. No language interpreter was used.  Emesis Severity:  Moderate Timing:  Intermittent Quality:  Bilious material Progression:  Worsening Chronicity:  New Recent urination:  Normal Relieved by:  Nothing Worsened by:  Nothing Ineffective treatments:  None tried Associated symptoms: abdominal pain   Associated symptoms: no cough, no diarrhea and no headaches        Home Medications Prior to Admission medications   Medication Sig Start Date End Date Taking? Authorizing Provider  albuterol (VENTOLIN HFA) 108 (90 Base) MCG/ACT inhaler Inhale 1-2 puffs into the lungs every 6 (six) hours as needed for shortness of breath. 11/25/18   Long, Arlyss Repress, MD  bacitracin ointment Apply 1 application. topically 2 (two) times daily. 06/07/21   Prosperi, Christian H, PA-C  HYDROcodone-acetaminophen (NORCO/VICODIN) 5-325 MG tablet Take 1 tablet by mouth every 4 (four) hours as needed. 10/09/21   Rancour, Jeannett Senior, MD  ibuprofen (ADVIL) 800 MG tablet Take 1 tablet (800 mg total) by mouth 3 (three) times daily. 06/07/21   Prosperi, Christian H, PA-C      Allergies    Shellfish allergy    Review of Systems   Review of Systems  Constitutional:  Negative for appetite change and fatigue.  HENT:  Negative for congestion, ear discharge and sinus pressure.   Eyes:  Negative for discharge.  Respiratory:  Negative for cough.   Cardiovascular:  Negative for chest pain.   Gastrointestinal:  Positive for abdominal pain and vomiting. Negative for diarrhea.  Genitourinary:  Negative for frequency and hematuria.  Musculoskeletal:  Negative for back pain.  Skin:  Negative for rash.  Neurological:  Negative for seizures and headaches.  Psychiatric/Behavioral:  Negative for hallucinations.     Physical Exam Updated Vital Signs BP (!) 166/111 (BP Location: Left Arm)   Pulse (!) 105   Temp 98 F (36.7 C) (Oral)   Resp 18   Ht 6\' 2"  (1.88 m)   Wt 102.1 kg   SpO2 99%   BMI 28.89 kg/m  Physical Exam Vitals and nursing note reviewed.  Constitutional:      Appearance: He is well-developed.  HENT:     Head: Normocephalic.     Nose: Nose normal.  Eyes:     General: No scleral icterus.    Conjunctiva/sclera: Conjunctivae normal.  Neck:     Thyroid: No thyromegaly.  Cardiovascular:     Rate and Rhythm: Normal rate and regular rhythm.     Heart sounds: No murmur heard.    No friction rub. No gallop.  Pulmonary:     Breath sounds: No stridor. No wheezing or rales.  Chest:     Chest wall: No tenderness.  Abdominal:     General: There is no distension.     Tenderness: There is abdominal tenderness. There is no rebound.     Comments: Moderate tenderness to periumbilical area  Musculoskeletal:        General: Normal range  of motion.     Cervical back: Neck supple.  Lymphadenopathy:     Cervical: No cervical adenopathy.  Skin:    Findings: No erythema or rash.  Neurological:     Mental Status: He is alert and oriented to person, place, and time.     Motor: No abnormal muscle tone.     Coordination: Coordination normal.  Psychiatric:        Behavior: Behavior normal.     ED Results / Procedures / Treatments   Labs (all labs ordered are listed, but only abnormal results are displayed) Labs Reviewed  COMPREHENSIVE METABOLIC PANEL - Abnormal; Notable for the following components:      Result Value   Sodium 133 (*)    Glucose, Bld 101 (*)     Total Protein 8.3 (*)    Albumin 5.3 (*)    AST 54 (*)    ALT 73 (*)    Total Bilirubin 1.5 (*)    All other components within normal limits  URINALYSIS, ROUTINE W REFLEX MICROSCOPIC - Abnormal; Notable for the following components:   Specific Gravity, Urine 1.004 (*)    All other components within normal limits  LIPASE, BLOOD  CBC  ALPHA GALACTOSIDASE    EKG None  Radiology No results found.  Procedures Procedures  {Document cardiac monitor, telemetry assessment procedure when appropriate:1}  Medications Ordered in ED Medications  0.9 %  sodium chloride infusion (has no administration in time range)  HYDROmorphone (DILAUDID) injection 0.5 mg (has no administration in time range)  ondansetron (ZOFRAN) injection 4 mg (has no administration in time range)  sodium chloride 0.9 % bolus 1,000 mL (1,000 mLs Intravenous New Bag/Given 07/29/22 2136)  pantoprazole (PROTONIX) injection 40 mg (40 mg Intravenous Given 07/29/22 2140)    ED Course/ Medical Decision Making/ A&P  CT scan shows intussusception of small bowel.  I spoke with general surgery Dr. Derrell Lolling and he agrees to admit the patient.  And reevaluate him in the morning {   Click here for ABCD2, HEART and other calculatorsREFRESH Note before signing :1}                          Medical Decision Making Amount and/or Complexity of Data Reviewed Labs: ordered. Radiology: ordered.  Risk Prescription drug management. Decision regarding hospitalization.   Patient with abdominal pain and intussusception of small bowel.  Patient will be admitted to general surgery Dr. [name]  {Document critical care time when appropriate:1} {Document review of labs and clinical decision tools ie heart score, Chads2Vasc2 etc:1}  {Document your independent review of radiology images, and any outside records:1} {Document your discussion with family members, caretakers, and with consultants:1} {Document social determinants of health affecting  pt's care:1} {Document your decision making why or why not admission, treatments were needed:1} Final Clinical Impression(s) / ED Diagnoses Final diagnoses:  None    Rx / DC Orders ED Discharge Orders     None

## 2022-07-29 NOTE — ED Triage Notes (Signed)
Pt states he has been vomiting any time he eats usually about 10 minutes afterwards.  C/O LUQ ABD pain as well. Pt drinks about 10 beer a day.

## 2022-07-29 NOTE — ED Notes (Signed)
ED TO INPATIENT HANDOFF REPORT  ED Nurse Name and Phone #: Deon Pilling [phone]  S Name/Age/Gender Cody Odom 37 y.o. male Room/Bed: WA24/WA24  Code Status   Code Status: Not on file  Home/SNF/Other Home Patient oriented to: self, place, time, and situation Is this baseline? Yes   Triage Complete: Triage complete  Chief Complaint Intussusception intestine (HCC) [K56.1]  Triage Note Pt states he has been vomiting any time he eats usually about 10 minutes afterwards.  C/O LUQ ABD pain as well. Pt drinks about 10 beer a day.   Allergies Allergies  Allergen Reactions   Shellfish Allergy     Level of Care/Admitting Diagnosis ED Disposition     ED Disposition  Admit   Condition  --   Comment  Hospital Area: [facility] [[id number]]  Level of Care: Med-Surg [16]  May admit patient to [facility] or [facility] if equivalent level of care is available:: No  Covid Evaluation: Asymptomatic - no recent exposure (last 10 days) testing not required  Diagnosis: Intussusception intestine [facility]) [454098]  Admitting Physician: Axel Filler [phone]  Attending Physician: Axel Filler [phone]  Certification:: I certify this patient will need inpatient services for at least 2 midnights  Estimated Length of Stay: 3          B Medical/Surgery History Past Medical History:  Diagnosis Date   Bronchitis    COPD (chronic obstructive pulmonary disease) (HCC)    Pneumonia    No past surgical history on file.   A IV Location/Drains/Wounds Patient Lines/Drains/Airways Status     Active Line/Drains/Airways     Name Placement date Placement time Site Days   Peripheral IV 07/29/22 20 G 1" Right Antecubital 07/29/22  2136  Antecubital  less than 1            Intake/Output Last 24 hours No intake or output data in the 24 hours ending 07/29/22 2243  Labs/Imaging Results for orders placed or performed during the hospital encounter of 07/29/22 (from  the past 48 hour(s))  Lipase, blood     Status: None   Collection Time: 07/29/22  9:03 PM  Result Value Ref Range   Lipase 26 11 - 51 U/L    Comment: Performed at [facility], [address] [place], [place] [address]  Comprehensive metabolic panel     Status: Abnormal   Collection Time: 07/29/22  9:03 PM  Result Value Ref Range   Sodium 133 (L) 135 - 145 mmol/L   Potassium 3.8 3.5 - 5.1 mmol/L   Chloride 98 98 - 111 mmol/L   CO2 23 22 - 32 mmol/L   Glucose, Bld 101 (H) 70 - 99 mg/dL    Comment: Glucose reference range applies only to samples taken after fasting for at least 8 hours.   BUN 8 6 - 20 mg/dL   Creatinine, Ser 3.08 0.61 - 1.24 mg/dL   Calcium 9.6 8.9 - 65.7 mg/dL   Total Protein 8.3 (H) 6.5 - 8.1 g/dL   Albumin 5.3 (H) 3.5 - 5.0 g/dL   AST 54 (H) 15 - 41 U/L   ALT 73 (H) 0 - 44 U/L   Alkaline Phosphatase 56 38 - 126 U/L   Total Bilirubin 1.5 (H) 0.3 - 1.2 mg/dL   GFR, Estimated >84 >69 mL/min    Comment: (NOTE) Calculated using the CKD-EPI Creatinine Equation (2021)    Anion gap 12 5 - 15    Comment: Performed at [facility], [address] [place], [place] [address]  CBC     Status: None   Collection Time: 07/29/22  9:03 PM  Result Value Ref Range   WBC 8.2 4.0 - 10.5 K/uL   RBC 4.92 4.22 - 5.81 MIL/uL   Hemoglobin 15.9 13.0 - 17.0 g/dL   HCT 60.4 54.0 - 98.1 %   MCV 92.7 80.0 - 100.0 fL   MCH 32.3 26.0 - 34.0 pg   MCHC 34.9 30.0 - 36.0 g/dL   RDW 19.1 47.8 - 29.5 %   Platelets 229 150 - 400 K/uL   nRBC 0.0 0.0 - 0.2 %    Comment: Performed at [facility], [address] [place], [place] [address]  Urinalysis, Routine w reflex microscopic -Urine, Clean Catch     Status: Abnormal   Collection Time: 07/29/22 10:15 PM  Result Value Ref Range   Color, Urine YELLOW YELLOW   APPearance CLEAR CLEAR   Specific Gravity, Urine 1.004 (L) 1.005 - 1.030   pH 6.0 5.0 - 8.0   Glucose, UA NEGATIVE NEGATIVE  mg/dL   Hgb urine dipstick NEGATIVE NEGATIVE   Bilirubin Urine NEGATIVE NEGATIVE   Ketones, ur NEGATIVE NEGATIVE mg/dL   Protein, ur NEGATIVE NEGATIVE mg/dL   Nitrite NEGATIVE NEGATIVE   Leukocytes,Ua NEGATIVE NEGATIVE    Comment: Performed at [facility], [address], [place], [place] [address]   DG ABD ACUTE 2+V W 1V CHEST  Result Date: 07/29/2022 CLINICAL DATA:  Pain EXAM: DG ABDOMEN ACUTE WITH 1 VIEW CHEST COMPARISON:  Chest x-ray 10/09/2021 FINDINGS: There is no evidence of dilated bowel loops or free intraperitoneal air. No radiopaque calculi or other significant radiographic abnormality is seen. Heart size and mediastinal contours are within normal limits. Both lungs are clear. IMPRESSION: Negative abdominal radiographs.  No acute cardiopulmonary disease. Electronically Signed   By: Darliss Cheney M.D.   On: 07/29/2022 22:37    Pending Labs Unresulted Labs (From admission, onward)     Start     Ordered   07/29/22 2150  Alpha galactosidase  Once,   URGENT        07/29/22 2150            Vitals/Pain Today's Vitals   07/29/22 2029 07/29/22 2030  BP: (!) 166/111   Pulse: (!) 105   Resp: 18   Temp: 98 F (36.7 C)   TempSrc: Oral   SpO2: 99%   Weight:  102.1 kg  Height:  6\' 2"  (1.88 m)  PainSc: 5      Isolation Precautions No active isolations  Medications Medications  0.9 %  sodium chloride infusion (has no administration in time range)  HYDROmorphone (DILAUDID) injection 0.5 mg (has no administration in time range)  ondansetron (ZOFRAN) injection 4 mg (has no administration in time range)  sodium chloride 0.9 % bolus 1,000 mL (0 mLs Intravenous Stopped 07/29/22 2243)  pantoprazole (PROTONIX) injection 40 mg (40 mg Intravenous Given 07/29/22 2140)    Mobility walks     Focused Assessments    R Recommendations: See Admitting Provider Note  Report given to:   Additional Notes:

## 2022-07-30 ENCOUNTER — Encounter (HOSPITAL_COMMUNITY): Payer: Self-pay | Admitting: General Surgery

## 2022-07-30 ENCOUNTER — Inpatient Hospital Stay (HOSPITAL_COMMUNITY): Payer: No Typology Code available for payment source

## 2022-07-30 LAB — URINALYSIS, ROUTINE W REFLEX MICROSCOPIC
Bilirubin Urine: NEGATIVE
Glucose, UA: NEGATIVE mg/dL
Hgb urine dipstick: NEGATIVE
Ketones, ur: 5 mg/dL — AB
Leukocytes,Ua: NEGATIVE
Nitrite: NEGATIVE
Protein, ur: NEGATIVE mg/dL
Specific Gravity, Urine: 1.004 — ABNORMAL LOW (ref 1.005–1.030)
pH: 6 (ref 5.0–8.0)

## 2022-07-30 LAB — COMPREHENSIVE METABOLIC PANEL
ALT: 59 U/L — ABNORMAL HIGH (ref 0–44)
AST: 40 U/L (ref 15–41)
Albumin: 4.4 g/dL (ref 3.5–5.0)
Alkaline Phosphatase: 47 U/L (ref 38–126)
Anion gap: 11 (ref 5–15)
BUN: 8 mg/dL (ref 6–20)
CO2: 23 mmol/L (ref 22–32)
Calcium: 9.2 mg/dL (ref 8.9–10.3)
Chloride: 101 mmol/L (ref 98–111)
Creatinine, Ser: 0.87 mg/dL (ref 0.61–1.24)
GFR, Estimated: >60 mL/min (ref >60)
Glucose, Bld: 91 mg/dL (ref 70–99)
Potassium: 3.8 mmol/L (ref 3.5–5.1)
Sodium: 135 mmol/L (ref 135–145)
Total Bilirubin: 1.7 mg/dL — ABNORMAL HIGH (ref 0.3–1.2)
Total Protein: 7.1 g/dL (ref 6.5–8.1)

## 2022-07-30 LAB — CBC WITH DIFFERENTIAL/PLATELET
Abs Immature Granulocytes: 0.02 10*3/uL (ref 0.00–0.07)
Basophils Absolute: 0.1 10*3/uL (ref 0.0–0.1)
Basophils Relative: 1 %
Eosinophils Absolute: 0.1 10*3/uL (ref 0.0–0.5)
Eosinophils Relative: 1 %
HCT: 42 % (ref 39.0–52.0)
Hemoglobin: 14.3 g/dL (ref 13.0–17.0)
Immature Granulocytes: 0 %
Lymphocytes Relative: 31 %
Lymphs Abs: 2.3 10*3/uL (ref 0.7–4.0)
MCH: 32.3 pg (ref 26.0–34.0)
MCHC: 34 g/dL (ref 30.0–36.0)
MCV: 94.8 fL (ref 80.0–100.0)
Monocytes Absolute: 0.7 10*3/uL (ref 0.1–1.0)
Monocytes Relative: 10 %
Neutro Abs: 4.2 10*3/uL (ref 1.7–7.7)
Neutrophils Relative %: 57 %
Platelets: 201 10*3/uL (ref 150–400)
RBC: 4.43 MIL/uL (ref 4.22–5.81)
RDW: 12.9 % (ref 11.5–15.5)
WBC: 7.3 10*3/uL (ref 4.0–10.5)
nRBC: 0 % (ref 0.0–0.2)

## 2022-07-30 LAB — LIPASE, BLOOD: Lipase: 26 U/L (ref 11–51)

## 2022-07-30 LAB — POC OCCULT BLOOD, ED: Fecal Occult Bld: NEGATIVE

## 2022-07-30 MED ORDER — PANTOPRAZOLE SODIUM 40 MG IV SOLR
40.0000 mg | Freq: Once | INTRAVENOUS | Status: AC
  Administered 2022-07-30: 40 mg via INTRAVENOUS
  Filled 2022-07-30: qty 10

## 2022-07-30 MED ORDER — OMEPRAZOLE 20 MG PO CPDR: 20.0000 mg | DELAYED_RELEASE_CAPSULE | Freq: Every day | ORAL | 0 refills | Status: AC

## 2022-07-30 MED ORDER — SUCRALFATE 1 G PO TABS: 1.0000 g | ORAL_TABLET | Freq: Three times a day (TID) | ORAL | 0 refills | Status: AC

## 2022-07-30 MED ORDER — IOHEXOL 300 MG/ML  SOLN
100.0000 mL | Freq: Once | INTRAMUSCULAR | Status: AC | PRN
  Administered 2022-07-30: 100 mL via INTRAVENOUS

## 2022-07-30 MED ORDER — LACTATED RINGERS IV BOLUS
1000.0000 mL | Freq: Once | INTRAVENOUS | Status: AC
  Administered 2022-07-30: 1000 mL via INTRAVENOUS

## 2022-07-30 MED ORDER — SODIUM CHLORIDE (PF) 0.9 % IJ SOLN
INTRAMUSCULAR | Status: AC
  Filled 2022-07-30: qty 50

## 2022-07-30 MED ORDER — ONDANSETRON 4 MG PO TBDP: 4.0000 mg | ORAL_TABLET | Freq: Three times a day (TID) | ORAL | 0 refills | Status: AC | PRN

## 2022-07-30 NOTE — ED Provider Notes (Signed)
[place] EMERGENCY DEPARTMENT AT [facility] Provider Note   CSN: [id number] Arrival date & time: 07/29/22  2019     History  Chief Complaint  Patient presents with   Emesis    Cody Odom is a 37 y.o. male.  Patient returns to the ED.  He was inadvertently admitted to the surgical floor as orders were placed on the incorrect patient during the previous shift.   Patient states he is here because of upper abdominal pain with nausea and vomiting inability to tolerate p.o. for the past 4 days.  States every time he tries to eat or drink he has vomiting and cannot keep anything down.  Over the past 4 days she has had constant left upper quadrant abdominal pain.  He denies any black or bloody stools and denies any constipation or diarrhea.  Denies any blood in his emesis.  He does drink about 10 beers a day and is able to keep those down.  Every time he tries to eat food or something like that he vomits immediately afterwards.  No history of ulcers or acid reflux.  Also take significant mount of ibuprofen due to previous back injury.  No history of EGD. No previous abdominal surgeries.  No history of alcohol withdrawals or tremors.  The history is provided by the patient.  Emesis Associated symptoms: abdominal pain   Associated symptoms: no arthralgias, no cough, no diarrhea, no headaches and no myalgias        Home Medications Prior to Admission medications   Medication Sig Start Date End Date Taking? Authorizing Provider  acetaminophen (TYLENOL) 500 MG tablet Take 500 mg by mouth every 6 (six) hours as needed for moderate pain.   Yes [provider]  albuterol (VENTOLIN HFA) 108 (90 Base) MCG/ACT inhaler Inhale 1-2 puffs into the lungs every 6 (six) hours as needed for shortness of breath. 11/25/18  Yes Long, Arlyss Repress, MD  ibuprofen (ADVIL) 200 MG tablet Take 200 mg by mouth every 6 (six) hours as needed for moderate pain.   Yes [provider]  bacitracin ointment Apply 1 application. topically 2 (two) times daily. Patient not taking: Reported on 07/29/2022 06/07/21   Prosperi, Christian H, PA-C  HYDROcodone-acetaminophen (NORCO/VICODIN) 5-325 MG tablet Take 1 tablet by mouth every 4 (four) hours as needed. Patient not taking: Reported on 07/29/2022 10/09/21   Glynn Octave, MD  ibuprofen (ADVIL) 800 MG tablet Take 1 tablet (800 mg total) by mouth 3 (three) times daily. Patient not taking: Reported on 07/29/2022 06/07/21   Prosperi, Christian H, PA-C      Allergies    Shellfish allergy    Review of Systems   Review of Systems  Constitutional:  Positive for activity change and appetite change.  HENT:  Negative for congestion and rhinorrhea.   Eyes:  Negative for visual disturbance.  Respiratory:  Negative for cough and shortness of breath.   Cardiovascular:  Negative for chest pain.  Gastrointestinal:  Positive for abdominal pain, nausea and vomiting. Negative for constipation and diarrhea.  Genitourinary:  Negative for dysuria and hematuria.  Musculoskeletal:  Negative for arthralgias and myalgias.  Skin:  Negative for rash.  Neurological:  Negative for dizziness, weakness and headaches.   all other systems are negative except as noted in the HPI and PMH.    Physical Exam Updated Vital Signs BP (!) 136/93 (BP Location: Left Arm)   Pulse 74   Temp 98.5 F (36.9 C) (Oral)  Resp 16   Ht 6\' 2"  (1.88 m)   Wt 102.1 kg   SpO2 100%   BMI 28.89 kg/m  Physical Exam Vitals and nursing note reviewed.  Constitutional:      General: He is not in acute distress.    Appearance: He is well-developed.  HENT:     Head: Normocephalic and atraumatic.     Mouth/Throat:     Pharynx: No oropharyngeal exudate.  Eyes:     Conjunctiva/sclera: Conjunctivae normal.     Pupils: Pupils are equal, round, and reactive to light.  Neck:     Comments: No meningismus. Cardiovascular:     Rate and Rhythm: Normal rate and regular rhythm.      Heart sounds: Normal heart sounds. No murmur heard. Pulmonary:     Effort: Pulmonary effort is normal. No respiratory distress.     Breath sounds: Normal breath sounds.  Abdominal:     Palpations: Abdomen is soft.     Tenderness: There is abdominal tenderness. There is no guarding or rebound.     Comments: TTP LUQ. No guarding or rebound  Musculoskeletal:        General: No tenderness. Normal range of motion.     Cervical back: Normal range of motion and neck supple.  Skin:    General: Skin is warm.  Neurological:     Mental Status: He is alert and oriented to person, place, and time.     Cranial Nerves: No cranial nerve deficit.     Motor: No abnormal muscle tone.     Coordination: Coordination normal.     Comments:  5/5 strength throughout. CN 2-12 intact.Equal grip strength.   Psychiatric:        Behavior: Behavior normal.     ED Results / Procedures / Treatments   Labs (all labs ordered are listed, but only abnormal results are displayed) Labs Reviewed  COMPREHENSIVE METABOLIC PANEL - Abnormal; Notable for the following components:      Result Value   Sodium 133 (*)    Glucose, Bld 101 (*)    Total Protein 8.3 (*)    Albumin 5.3 (*)    AST 54 (*)    ALT 73 (*)    Total Bilirubin 1.5 (*)    All other components within normal limits  URINALYSIS, ROUTINE W REFLEX MICROSCOPIC - Abnormal; Notable for the following components:   Specific Gravity, Urine 1.004 (*)    All other components within normal limits  URINALYSIS, ROUTINE W REFLEX MICROSCOPIC - Abnormal; Notable for the following components:   Color, Urine STRAW (*)    Specific Gravity, Urine 1.004 (*)    Ketones, ur 5 (*)    All other components within normal limits  COMPREHENSIVE METABOLIC PANEL - Abnormal; Notable for the following components:   ALT 59 (*)    Total Bilirubin 1.7 (*)    All other components within normal limits  LIPASE, BLOOD  CBC  CBC WITH DIFFERENTIAL/PLATELET  LIPASE, BLOOD  ALPHA  GALACTOSIDASE  POC OCCULT BLOOD, ED    EKG EKG Interpretation Date/Time:  Tuesday July 29 2022 21:15:30 EDT Ventricular Rate:  94 PR Interval:  187 QRS Duration:  76 QT Interval:  325 QTC Calculation: 407 R Axis:   71  Text Interpretation: Sinus rhythm Consider left atrial enlargement No significant change was found Confirmed by Glynn Octave [phone]) on 07/30/2022 5:17:34 AM  Radiology CT ABDOMEN PELVIS W CONTRAST  Result Date: 07/30/2022 CLINICAL DATA:  37 year old male with history  of nausea and vomiting and abdominal pain. EXAM: CT ABDOMEN AND PELVIS WITH CONTRAST TECHNIQUE: Multidetector CT imaging of the abdomen and pelvis was performed using the standard protocol following bolus administration of intravenous contrast. RADIATION DOSE REDUCTION: This exam was performed according to the departmental dose-optimization program which includes automated exposure control, adjustment of the mA and/or kV according to patient size and/or use of iterative reconstruction technique. CONTRAST:  OMNIPAQUE IOHEXOL 300 MG/ML  SOLN COMPARISON:  CT of the abdomen and pelvis 10/09/2021. FINDINGS: Lower chest: Unremarkable. Hepatobiliary: Diffuse low attenuation throughout the hepatic parenchyma, indicative of a background of severe hepatic steatosis. No suspicious cystic or solid hepatic lesions. No intra or extrahepatic biliary ductal dilatation. Gallbladder is normal in appearance. Pancreas: No pancreatic mass. No pancreatic ductal dilatation. No pancreatic or peripancreatic fluid collections or inflammatory changes. Spleen: Unremarkable. Adrenals/Urinary Tract: 1.9 x 1.7 cm left adrenal nodule, stable compared to the prior study, incompletely characterize, but statistically likely a benign lesions such as a adenoma. Right adrenal gland and bilateral kidneys are otherwise normal in appearance. No hydroureteronephrosis. Urinary bladder is nearly decompressed but otherwise normal in appearance.  Stomach/Bowel: The appearance of the stomach is normal. No pathologic dilatation of small bowel or colon. Normal appendix. Vascular/Lymphatic: Mild atherosclerosis of the abdominal aorta. No aneurysm or dissection noted in the abdominal aorta or pelvic vasculature. No lymphadenopathy noted in the abdomen or pelvis. Reproductive: Prostate gland and seminal vesicles are unremarkable in appearance. Other: No significant volume of ascites.  No pneumoperitoneum. Musculoskeletal: There are no aggressive appearing lytic or blastic lesions noted in the visualized portions of the skeleton. IMPRESSION: 1. No acute findings are noted in the abdomen or pelvis to account for the patient's symptoms. 2. Severe hepatic steatosis. 3. 1.9 x 1.7 cm left adrenal nodule is similar to the prior study and statistically likely a benign lesions such as a adenoma. No imaging follow-up is recommended. Electronically Signed   By: Trudie Reed M.D.   On: 07/30/2022 05:23   DG ABD ACUTE 2+V W 1V CHEST  Result Date: 07/29/2022 CLINICAL DATA:  Pain EXAM: DG ABDOMEN ACUTE WITH 1 VIEW CHEST COMPARISON:  Chest x-ray 10/09/2021 FINDINGS: There is no evidence of dilated bowel loops or free intraperitoneal air. No radiopaque calculi or other significant radiographic abnormality is seen. Heart size and mediastinal contours are within normal limits. Both lungs are clear. IMPRESSION: Negative abdominal radiographs.  No acute cardiopulmonary disease. Electronically Signed   By: Darliss Cheney M.D.   On: 07/29/2022 22:37    Procedures Procedures    Medications Ordered in ED Medications  HYDROmorphone (DILAUDID) injection 0.5 mg (has no administration in time range)  ondansetron (ZOFRAN) injection 4 mg (has no administration in time range)  sodium chloride 0.9 % bolus 1,000 mL (0 mLs Intravenous Stopped 07/29/22 2243)  pantoprazole (PROTONIX) injection 40 mg (40 mg Intravenous Given 07/29/22 2140)  0.9 %  sodium chloride infusion (0 mLs  Intravenous Stopped 07/30/22 0139)  lactated ringers bolus 1,000 mL (1,000 mLs Intravenous New Bag/Given 07/30/22 0209)  pantoprazole (PROTONIX) injection 40 mg (40 mg Intravenous Given 07/30/22 0210)    ED Course/ Medical Decision Making/ A&P                             Medical Decision Making Amount and/or Complexity of Data Reviewed Labs: ordered. Decision-making details documented in ED Course. Radiology: ordered and independent interpretation performed. Decision-making details documented in ED Course.  ECG/medicine tests: ordered and independent interpretation performed. Decision-making details documented in ED Course.  Risk Prescription drug management.   Patient with history of alcohol use here with upper abdominal pain, nausea and vomiting and difficulty tolerating p.o.  Denies any black or bloody stools.  Denies any blood in the emesis.  His vital signs are stable.  His abdomen is soft without peritoneal signs.  Will titrate, treat symptoms, check labs and CT scan  Labs reassuring, lipase normal, no leukocytosis.  Minimal transaminitis similar to baseline likely consistent with chronic alcohol use. Hemoccult is negative.  Hemoglobin is stable.  Patient given IV fluids, IV Protonix, pain and nausea control.  No vomiting throughout ED course. CT scan reassuring.  Evidence of hepatic steatosis as well as adrenal nodule.  Discussed with patient.  Patient tolerating p.o.  Feels improved and wants to go home. Continue PPI.  Avoid alcohol, caffeine, NSAID medications, spicy foods.  Follow-up with gastroenterology for EGD.  Return to the ED with worsening abdominal pain, nausea, vomiting, black or bloody stools, blood in his emesis, not able to eat or drink or other concerns.        Final Clinical Impression(s) / ED Diagnoses Final diagnoses:  Upper abdominal pain  Acute gastritis without hemorrhage, unspecified gastritis type    Rx / DC Orders ED Discharge Orders      None         Laci Frenkel, Jeannett Senior, MD 07/30/22 [phone]

## 2022-07-30 NOTE — ED Notes (Signed)
Pt advises being unable to void at this time, will monitor following bolus completion.

## 2022-07-30 NOTE — ED Notes (Signed)
Pt advises being unable to void at this time, will monitor. °

## 2022-07-30 NOTE — Discharge Instructions (Addendum)
Your testing is reassuring.  Your labs are within normal limits.  Your CT scan showed some fatty changes of your liver as well as a nodule on your adrenal gland which should not need further follow-up.  As we discussed take the stomach medication as prescribed.  Avoid alcohol, caffeine, NSAID medications and spicy foods.  Follow-up with a gastroenterologist to have an endoscopy to assess for ulcers.  Return to the ED with worsening pain, not able to eat or drink, black or bloody stools, blood in your vomit, or any other concerns.

## 2022-07-30 NOTE — ED Notes (Signed)
Pt tolerated PO water w/o issue.
